# Patient Record
Sex: Male | Born: 1990
Health system: Southern US, Community
[De-identification: ages and names within clinical notes are randomized; demographics above are authoritative.]

## PROBLEM LIST (undated history)

## (undated) DIAGNOSIS — N200 Calculus of kidney: Secondary | ICD-10-CM

## (undated) DIAGNOSIS — J45909 Unspecified asthma, uncomplicated: Secondary | ICD-10-CM

## (undated) HISTORY — PX: HERNIA REPAIR: SHX51

---

## 2015-08-19 ENCOUNTER — Emergency Department (HOSPITAL_BASED_OUTPATIENT_CLINIC_OR_DEPARTMENT_OTHER): Payer: 59

## 2015-08-19 ENCOUNTER — Emergency Department (HOSPITAL_BASED_OUTPATIENT_CLINIC_OR_DEPARTMENT_OTHER)
Admission: EM | Admit: 2015-08-19 | Discharge: 2015-08-19 | Disposition: A | Discharge status: Home / Self Care | Payer: 59 | Attending: Emergency Medicine | Admitting: Emergency Medicine

## 2015-08-19 ENCOUNTER — Encounter (HOSPITAL_BASED_OUTPATIENT_CLINIC_OR_DEPARTMENT_OTHER): Payer: Self-pay | Admitting: *Deleted

## 2015-08-19 DIAGNOSIS — N23 Unspecified renal colic: Secondary | ICD-10-CM | POA: Diagnosis not present

## 2015-08-19 DIAGNOSIS — J45909 Unspecified asthma, uncomplicated: Secondary | ICD-10-CM | POA: Diagnosis not present

## 2015-08-19 DIAGNOSIS — R109 Unspecified abdominal pain: Secondary | ICD-10-CM | POA: Diagnosis present

## 2015-08-19 HISTORY — DX: Unspecified asthma, uncomplicated: J45.909

## 2015-08-19 HISTORY — DX: Calculus of kidney: N20.0

## 2015-08-19 LAB — URINALYSIS, ROUTINE W REFLEX MICROSCOPIC
Bilirubin Urine: NEGATIVE
GLUCOSE, UA: NEGATIVE mg/dL
KETONES UR: NEGATIVE mg/dL
Leukocytes, UA: NEGATIVE
Nitrite: NEGATIVE
PROTEIN: 30 mg/dL — AB
SPECIFIC GRAVITY, URINE: 1.023 (ref 1.005–1.030)
pH: 6 (ref 5.0–8.0)

## 2015-08-19 LAB — URINE MICROSCOPIC-ADD ON

## 2015-08-19 MED ORDER — ONDANSETRON HCL 4 MG/2ML IJ SOLN
4.0000 mg | Freq: Once | INTRAMUSCULAR | Status: AC
  Administered 2015-08-19: 4 mg via INTRAVENOUS
  Filled 2015-08-19: qty 2

## 2015-08-19 MED ORDER — KETOROLAC TROMETHAMINE 15 MG/ML IJ SOLN
15.0000 mg | Freq: Once | INTRAMUSCULAR | Status: AC
  Administered 2015-08-19: 15 mg via INTRAVENOUS
  Filled 2015-08-19: qty 1

## 2015-08-19 MED ORDER — SODIUM CHLORIDE 0.9 % IV SOLN
INTRAVENOUS | Status: DC
  Administered 2015-08-19: 1000 mL via INTRAVENOUS

## 2015-08-19 MED ORDER — HYDROMORPHONE HCL 1 MG/ML IJ SOLN: 1.0000 mg | Freq: Once | INTRAMUSCULAR | Status: DC | PRN

## 2015-08-19 NOTE — ED Notes (Addendum)
C/o rt flank pain onset this am,  w n/v x 1,  Pt states pain is not as bad now as at time of onset

## 2015-08-19 NOTE — ED Provider Notes (Signed)
CSN: [id number]     Arrival date & time 08/19/15  0422 History   First MD Initiated Contact with Patient 08/19/15 [phone]     Chief Complaint  Patient presents with  . Flank Pain     (Consider location/radiation/quality/duration/timing/severity/associated sxs/prior Treatment) HPI  This is a 25 year old male with a remote history of one kidney stone in the past. He is here with severe right flank pain that woke him up about an hour ago. He rated his pain as a 9 out of 10 initially and a 3 out of 10 now. He has had associated nausea with one episode of vomiting. The pain does not change with movement. His urine was grossly bloody.   Past Medical History  Diagnosis Date  . Kidney stones   . Asthma    No past surgical history on file. No family history on file. Social History  Substance Use Topics  . Smoking status: Never Smoker   . Smokeless tobacco: None  . Alcohol Use: Yes    Review of Systems  All other systems reviewed and are negative.   Allergies  Sulfa antibiotics  Home Medications   Prior to Admission medications   Not on File   BP 129/90 mmHg  Pulse 92  Temp(Src) 99 F (37.2 C) (Oral)  Resp 18  Ht 5\' 9"  (1.753 m)  Wt 320 lb (145.151 kg)  BMI 47.23 kg/m2  SpO2 99%   Physical Exam General: Well-developed, well-nourished male in no acute distress; appearance consistent with age of record HENT: normocephalic; atraumatic Eyes: pupils equal, round and reactive to light; extraocular muscles intact Neck: supple Heart: regular rate and rhythm Lungs: clear to auscultation bilaterally Abdomen: soft; nondistended; nontender; no masses or hepatosplenomegaly; bowel sounds present GU: No CVA tenderness Extremities: No deformity; full range of motion; pulses normal Neurologic: Awake, alert and oriented; motor function intact in all extremities and symmetric; no facial droop Skin: Warm and dry Psychiatric: Mildly anxious    ED Course  Procedures (including critical  care time)   MDM   Nursing notes and vitals signs, including pulse oximetry, reviewed.  Summary of this visit's results, reviewed by myself:  Labs:  Results for orders placed or performed during the hospital encounter of 08/19/15 (from the past 24 hour(s))  Urinalysis, Routine w reflex microscopic (not at [facility])     Status: Abnormal   Collection Time: 08/19/15  4:34 AM  Result Value Ref Range   Color, Urine RED (A) YELLOW   APPearance TURBID (A) CLEAR   Specific Gravity, Urine 1.023 1.005 - 1.030   pH 6.0 5.0 - 8.0   Glucose, UA NEGATIVE NEGATIVE mg/dL   Hgb urine dipstick LARGE (A) NEGATIVE   Bilirubin Urine NEGATIVE NEGATIVE   Ketones, ur NEGATIVE NEGATIVE mg/dL   Protein, ur 30 (A) NEGATIVE mg/dL   Nitrite NEGATIVE NEGATIVE   Leukocytes, UA NEGATIVE NEGATIVE  Urine microscopic-add on     Status: Abnormal   Collection Time: 08/19/15  4:34 AM  Result Value Ref Range   Squamous Epithelial / LPF 6-30 (A) NONE SEEN   WBC, UA 0-5 0 - 5 WBC/hpf   RBC / HPF TOO NUMEROUS TO COUNT 0 - 5 RBC/hpf   Bacteria, UA FEW (A) NONE SEEN    Imaging Studies: Ct Renal Stone Study  08/19/2015  CLINICAL DATA:  Initial evaluation for acute right flank pain. Hematuria. EXAM: CT ABDOMEN AND PELVIS WITHOUT CONTRAST TECHNIQUE: Multidetector CT imaging of the abdomen and pelvis was performed following the  standard protocol without IV contrast. COMPARISON:  None. FINDINGS: Visualized lung bases are clear. Limited noncontrast evaluation of the liver is unremarkable. Gallbladder demonstrates no acute abnormality. No biliary dilatation. Spleen, adrenal glands, and pancreas demonstrate a normal unenhanced appearance. Scattered nonobstructive calculi present within the left kidney, largest of which positioned in the lower pole and measures 4 mm. No radiopaque calculi seen along the course of the left renal collecting system. There is no left-sided hydronephrosis or hydroureter. On the right, scattered  nonobstructive calculi are present, largest of which measures 6 mm and is positioned in the lower pole. No radiopaque calculi seen along the course of the right renal collecting system. No right-sided hydronephrosis or hydroureter. Stomach within normal limits. No evidence for bowel obstruction. No abnormal wall thickening or inflammatory fat stranding seen about the bowels. Appendix is normal. Bladder decompressed without acute abnormality.  Prostate normal. No free air or fluid. Shotty subcentimeter periaortic lymph nodes present. No pathologically enlarged intra-abdominal or pelvic lymph nodes identified. No acute osseous abnormality. No worrisome lytic or blastic osseous lesions. IMPRESSION: 1. Bilateral nonobstructive nephrolithiasis as above. No CT evidence for obstructive uropathy. 2. No other acute intra-abdominal or pelvic process. Electronically Signed   By: Jeannine Boga M.D.   On: 08/19/2015 05:32   5:47 AM CT findings and history consistent with passed stone. Patient was advised of CT findings. He was advised that hematuria should resolve but if it persists we will refer to urology for further evaluation.     Shanon Rosser, MD 08/19/15 [phone]

## 2015-08-21 ENCOUNTER — Emergency Department (HOSPITAL_BASED_OUTPATIENT_CLINIC_OR_DEPARTMENT_OTHER)
Admission: EM | Admit: 2015-08-21 | Discharge: 2015-08-21 | Disposition: A | Discharge status: Home / Self Care | Payer: 59 | Attending: Emergency Medicine | Admitting: Emergency Medicine

## 2015-08-21 ENCOUNTER — Emergency Department (HOSPITAL_BASED_OUTPATIENT_CLINIC_OR_DEPARTMENT_OTHER): Payer: 59

## 2015-08-21 ENCOUNTER — Encounter (HOSPITAL_BASED_OUTPATIENT_CLINIC_OR_DEPARTMENT_OTHER): Payer: Self-pay | Admitting: Emergency Medicine

## 2015-08-21 DIAGNOSIS — N23 Unspecified renal colic: Secondary | ICD-10-CM | POA: Insufficient documentation

## 2015-08-21 DIAGNOSIS — J45909 Unspecified asthma, uncomplicated: Secondary | ICD-10-CM | POA: Diagnosis not present

## 2015-08-21 DIAGNOSIS — R109 Unspecified abdominal pain: Secondary | ICD-10-CM | POA: Diagnosis present

## 2015-08-21 LAB — BASIC METABOLIC PANEL
Anion gap: 6 (ref 5–15)
BUN: 15 mg/dL (ref 6–20)
CALCIUM: 9.1 mg/dL (ref 8.9–10.3)
CHLORIDE: 108 mmol/L (ref 101–111)
CO2: 26 mmol/L (ref 22–32)
CREATININE: 0.85 mg/dL (ref 0.61–1.24)
GFR calc Af Amer: >60 mL/min (ref >60)
GFR calc non Af Amer: >60 mL/min (ref >60)
GLUCOSE: 99 mg/dL (ref 65–99)
Potassium: 3.8 mmol/L (ref 3.5–5.1)
Sodium: 140 mmol/L (ref 135–145)

## 2015-08-21 LAB — URINE MICROSCOPIC-ADD ON

## 2015-08-21 LAB — CBC
HCT: 43.6 % (ref 39.0–52.0)
Hemoglobin: 14.5 g/dL (ref 13.0–17.0)
MCH: 28.1 pg (ref 26.0–34.0)
MCHC: 33.3 g/dL (ref 30.0–36.0)
MCV: 84.5 fL (ref 78.0–100.0)
Platelets: 267 10*3/uL (ref 150–400)
RBC: 5.16 MIL/uL (ref 4.22–5.81)
RDW: 13.1 % (ref 11.5–15.5)
WBC: 10.1 10*3/uL (ref 4.0–10.5)

## 2015-08-21 LAB — URINALYSIS, ROUTINE W REFLEX MICROSCOPIC
BILIRUBIN URINE: NEGATIVE
Glucose, UA: NEGATIVE mg/dL
KETONES UR: NEGATIVE mg/dL
Leukocytes, UA: NEGATIVE
NITRITE: NEGATIVE
PH: 8 (ref 5.0–8.0)
Protein, ur: NEGATIVE mg/dL
Specific Gravity, Urine: 1.02 (ref 1.005–1.030)

## 2015-08-21 MED ORDER — ONDANSETRON HCL 4 MG/2ML IJ SOLN
4.0000 mg | Freq: Once | INTRAMUSCULAR | Status: AC | PRN
  Administered 2015-08-21: 4 mg via INTRAVENOUS
  Filled 2015-08-21: qty 2

## 2015-08-21 MED ORDER — FENTANYL CITRATE (PF) 100 MCG/2ML IJ SOLN
50.0000 ug | INTRAMUSCULAR | Status: AC | PRN
  Administered 2015-08-21 (×2): 50 ug via INTRAVENOUS
  Filled 2015-08-21 (×2): qty 2

## 2015-08-21 MED ORDER — HYDROMORPHONE HCL 1 MG/ML IJ SOLN
0.5000 mg | Freq: Once | INTRAMUSCULAR | Status: AC
  Administered 2015-08-21: 0.5 mg via INTRAVENOUS
  Filled 2015-08-21: qty 1

## 2015-08-21 MED ORDER — KETOROLAC TROMETHAMINE 30 MG/ML IJ SOLN
30.0000 mg | Freq: Once | INTRAMUSCULAR | Status: AC
  Administered 2015-08-21: 30 mg via INTRAVENOUS
  Filled 2015-08-21: qty 1

## 2015-08-21 MED ORDER — NAPROXEN 375 MG PO TABS: 375.0000 mg | ORAL_TABLET | Freq: Two times a day (BID) | ORAL | Status: AC

## 2015-08-21 MED ORDER — HYDROCODONE-ACETAMINOPHEN 5-325 MG PO TABS: 1.0000 | ORAL_TABLET | ORAL | Status: AC | PRN

## 2015-08-21 NOTE — ED Notes (Signed)
MD at bedside. 

## 2015-08-21 NOTE — ED Notes (Signed)
Sudden onset of R flank pain 45 min ago. Dx with multiple kidney stones 2 days ago. Pt states he has passed some. Pt reports N/V

## 2015-08-21 NOTE — Discharge Instructions (Signed)
Kidney Stones °Kidney stones (urolithiasis) are deposits that form inside your kidneys. The intense pain is caused by the stone moving through the urinary tract. When the stone moves, the ureter goes into spasm around the stone. The stone is usually passed in the urine.  °CAUSES  °· A disorder that makes certain neck glands produce too much parathyroid hormone (primary hyperparathyroidism). °· A buildup of uric acid crystals, similar to gout in your joints. °· Narrowing (stricture) of the ureter. °· A kidney obstruction present at birth (congenital obstruction). °· Previous surgery on the kidney or ureters. °· Numerous kidney infections. °SYMPTOMS  °· Feeling sick to your stomach (nauseous). °· Throwing up (vomiting). °· Blood in the urine (hematuria). °· Pain that usually spreads (radiates) to the groin. °· Frequency or urgency of urination. °DIAGNOSIS  °· Taking a history and physical exam. °· Blood or urine tests. °· CT scan. °· Occasionally, an examination of the inside of the urinary bladder (cystoscopy) is performed. °TREATMENT  °· Observation. °· Increasing your fluid intake. °· Extracorporeal shock wave lithotripsy--This is a noninvasive procedure that uses shock waves to break up kidney stones. °· Surgery may be needed if you have severe pain or persistent obstruction. There are various surgical procedures. Most of the procedures are performed with the use of small instruments. Only small incisions are needed to accommodate these instruments, so recovery time is minimized. °The size, location, and chemical composition are all important variables that will determine the proper choice of action for you. Talk to your health care provider to better understand your situation so that you will minimize the risk of injury to yourself and your kidney.  °HOME CARE INSTRUCTIONS  °· Drink enough water and fluids to keep your urine clear or pale yellow. This will help you to pass the stone or stone fragments. °· Strain  all urine through the provided strainer. Keep all particulate matter and stones for your health care provider to see. The stone causing the pain may be as small as a grain of salt. It is very important to use the strainer each and every time you pass your urine. The collection of your stone will allow your health care provider to analyze it and verify that a stone has actually passed. The stone analysis will often identify what you can do to reduce the incidence of recurrences. °· Only take over-the-counter or prescription medicines for pain, discomfort, or fever as directed by your health care provider. °· Keep all follow-up visits as told by your health care provider. This is important. °· Get follow-up X-rays if required. The absence of pain does not always mean that the stone has passed. It may have only stopped moving. If the urine remains completely obstructed, it can cause loss of kidney function or even complete destruction of the kidney. It is your responsibility to make sure X-rays and follow-ups are completed. Ultrasounds of the kidney can show blockages and the status of the kidney. Ultrasounds are not associated with any radiation and can be performed easily in a matter of minutes. °· Make changes to your daily diet as told by your health care provider. You may be told to: °¨ Limit the amount of salt that you eat. °¨ Eat 5 or more servings of fruits and vegetables each day. °¨ Limit the amount of meat, poultry, fish, and eggs that you eat. °· Collect a 24-hour urine sample as told by your health care provider. You may need to collect another urine sample every 6-12   months. °SEEK MEDICAL CARE IF: °· You experience pain that is progressive and unresponsive to any pain medicine you have been prescribed. °SEEK IMMEDIATE MEDICAL CARE IF:  °· Pain cannot be controlled with the prescribed medicine. °· You have a fever or shaking chills. °· The severity or intensity of pain increases over 18 hours and is not  relieved by pain medicine. °· You develop a new onset of abdominal pain. °· You feel faint or pass out. °· You are unable to urinate. °  °This information is not intended to replace advice given to you by your health care provider. Make sure you discuss any questions you have with your health care provider. °  °Document Released: 02/20/2005 Document Revised: 11/11/2014 Document Reviewed: 07/24/2012 °Elsevier Interactive Patient Education ©2016 [employer] ° °

## 2015-08-21 NOTE — ED Provider Notes (Signed)
CSN: [id number]     Arrival date & time 08/21/15  [id number] History   First MD Initiated Contact with Patient 08/21/15 0754     Chief Complaint  Patient presents with  . Flank Pain     (Consider location/radiation/quality/duration/timing/severity/associated sxs/prior Treatment) HPI Comments: Patient presents with right flank pain. He has a history of a prior kidney stone several years ago. He was seen here 2 days ago with right flank pain. He had gross hematuria at that time. He had a CT scan which did not show any evidence of a passing stone although there were stones in both kidneys. There was no hydronephrosis.  He states that today the pain completely went away. He woke up this morning about 7:00 with sudden onset of pain again in his right flank. It now feels lower and radiates to his right groin. He's had associated nausea and vomiting. No fevers. He states initially he has hematuria went away but now it seems to be back again. His pain is progressively worsened since it started.  Patient is a 25 y.o. male presenting with flank pain.  Flank Pain Associated symptoms include abdominal pain. Pertinent negatives include no chest pain, no headaches and no shortness of breath.    Past Medical History  Diagnosis Date  . Kidney stones   . Asthma    History reviewed. No pertinent past surgical history. No family history on file. Social History  Substance Use Topics  . Smoking status: Never Smoker   . Smokeless tobacco: None  . Alcohol Use: Yes    Review of Systems  Constitutional: Negative for fever, chills, diaphoresis and fatigue.  HENT: Negative for congestion, rhinorrhea and sneezing.   Eyes: Negative.   Respiratory: Negative for cough, chest tightness and shortness of breath.   Cardiovascular: Negative for chest pain and leg swelling.  Gastrointestinal: Positive for nausea, vomiting and abdominal pain. Negative for diarrhea and blood in stool.  Genitourinary: Positive for flank  pain. Negative for frequency, hematuria and difficulty urinating.  Musculoskeletal: Negative for back pain and arthralgias.  Skin: Negative for rash.  Neurological: Negative for dizziness, speech difficulty, weakness, numbness and headaches.      Allergies  Sulfa antibiotics  Home Medications   Prior to Admission medications   Medication Sig Start Date End Date Taking? Authorizing Provider  HYDROcodone-acetaminophen (NORCO/VICODIN) 5-325 MG tablet Take 1-2 tablets by mouth every 4 (four) hours as needed. 08/21/15   Malvin Johns, MD  naproxen (NAPROSYN) 375 MG tablet Take 1 tablet (375 mg total) by mouth 2 (two) times daily. 08/21/15   Malvin Johns, MD   BP 123/82 mmHg  Pulse 75  Temp(Src) 98.6 F (37 C) (Oral)  Resp 17  Ht 5\' 10"  (1.778 m)  Wt 320 lb (145.151 kg)  BMI 45.92 kg/m2  SpO2 100% Physical Exam  Constitutional: He is oriented to person, place, and time. He appears well-developed and well-nourished. He appears distressed (Appears uncomfortable).  HENT:  Head: Normocephalic and atraumatic.  Eyes: Pupils are equal, round, and reactive to light.  Neck: Normal range of motion. Neck supple.  Cardiovascular: Normal rate, regular rhythm and normal heart sounds.   Pulmonary/Chest: Effort normal and breath sounds normal. No respiratory distress. He has no wheezes. He has no rales. He exhibits no tenderness.  Abdominal: Soft. Bowel sounds are normal. There is tenderness (Moderate tenderness to the right lower abdomen and right inguinal area. No palpable hernia.). There is no rebound and no guarding.  Genitourinary:  No palpable tenderness  to his testicles  Musculoskeletal: Normal range of motion. He exhibits no edema.  Lymphadenopathy:    He has no cervical adenopathy.  Neurological: He is alert and oriented to person, place, and time.  Skin: Skin is warm and dry. No rash noted.  Psychiatric: He has a normal mood and affect.    ED Course  Procedures (including critical  care time) Labs Review Labs Reviewed  URINALYSIS, ROUTINE W REFLEX MICROSCOPIC (NOT AT [facility]) - Abnormal; Notable for the following:    APPearance CLOUDY (*)    Hgb urine dipstick MODERATE (*)    All other components within normal limits  URINE MICROSCOPIC-ADD ON - Abnormal; Notable for the following:    Squamous Epithelial / LPF 0-5 (*)    Bacteria, UA MANY (*)    Casts GRANULAR CAST (*)    All other components within normal limits  CBC  BASIC METABOLIC PANEL    Imaging Review Dg Abd 1 View  08/21/2015  CLINICAL DATA:  Patient with right flank pain. Recent passage of a right renal stone. EXAM: ABDOMEN - 1 VIEW COMPARISON:  CT abdomen pelvis 08/19/2015 FINDINGS: Lung bases are clear. Gas is demonstrated within nondilated loops of large and small bowel in a nonobstructed pattern. Unremarkable osseous skeleton. Known bilateral renal stones are not well demonstrated on current evaluation. IMPRESSION: Nonobstructed bowel gas pattern. Known bilateral renal stones are not well demonstrated on current evaluation. Electronically Signed   By: Lovey Newcomer M.D.   On: 08/21/2015 09:27   I have personally reviewed and evaluated these images and lab results as part of my medical decision-making.   EKG Interpretation None      MDM   Final diagnoses:  Renal colic    Patient presents with right flank pain consistent with his prior renal colic. He had a recent CT scan 2 days ago which showed stones in the kidneys but no ureteral stones. He does have ongoing hematuria. There is no evidence of infection. He has no discrete testicular pain that would be concerning for torsion. He was discharged home in good condition. His pain was controlled in the ED with pain medications. He was encouraged to have outpatient follow-up with urology within the next week. He was advised to return here if he has any worsening pain vomiting fevers or difficulty urinating. He was given prescriptions for Naprosyn and Vicodin  for pain control.    Malvin Johns, MD 08/21/15 [phone]

## 2015-08-21 NOTE — ED Notes (Signed)
Patient transported to X-ray 

## 2016-04-07 DIAGNOSIS — K648 Other hemorrhoids: Secondary | ICD-10-CM | POA: Diagnosis not present

## 2016-08-03 DIAGNOSIS — M76891 Other specified enthesopathies of right lower limb, excluding foot: Secondary | ICD-10-CM | POA: Diagnosis not present

## 2017-03-17 DIAGNOSIS — Z0000 Encounter for general adult medical examination without abnormal findings: Secondary | ICD-10-CM | POA: Diagnosis not present

## 2017-05-22 IMAGING — CT CT RENAL STONE PROTOCOL
2 of 4 series · 17 of 46 positions shown, 19 images · non-contrast
Comparison: None.

CLINICAL DATA: Initial evaluation for acute right flank pain.
Hematuria.

EXAM:
CT ABDOMEN AND PELVIS WITHOUT CONTRAST
TECHNIQUE: Multidetector CT imaging of the abdomen and pelvis was performed
following the standard protocol without IV contrast.

[Series 2: axial st · axial · 0.98mm/px · z∈[-571,-76]mm · 14 of 109 slices shown, 16 images]
[im 5/109  soft-tissue]
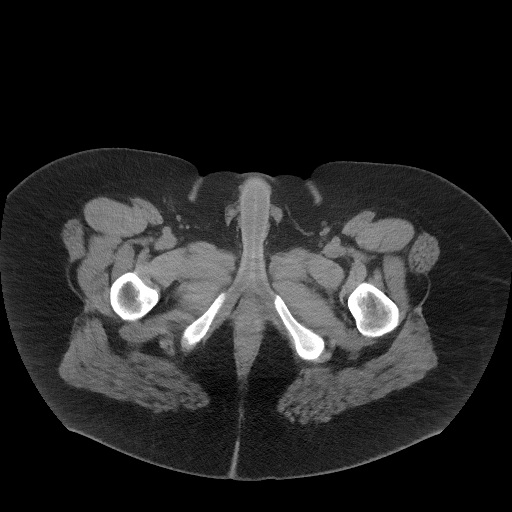
[im 5/109  bone]
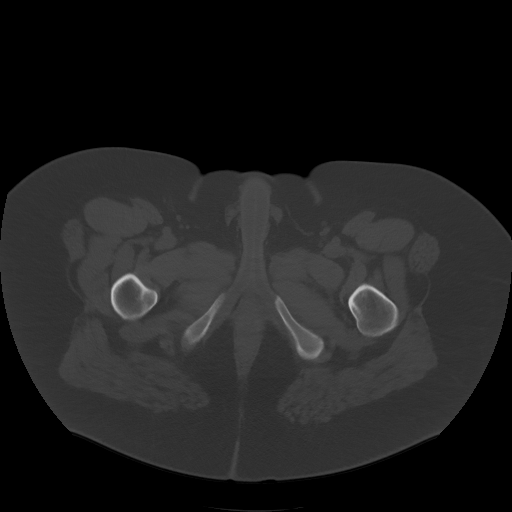
[im 14/109  soft-tissue]
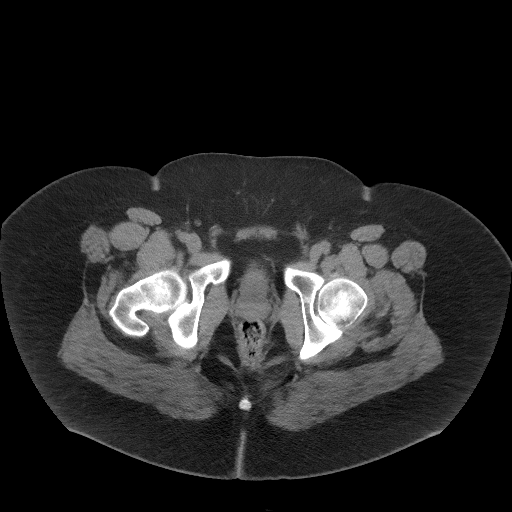
[im 23/109  soft-tissue]
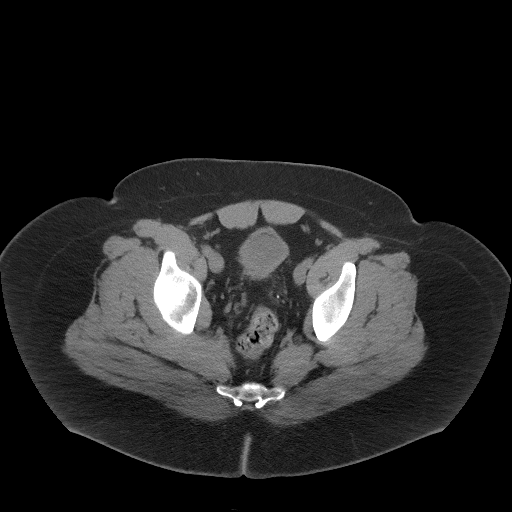
[im 28/109  soft-tissue]
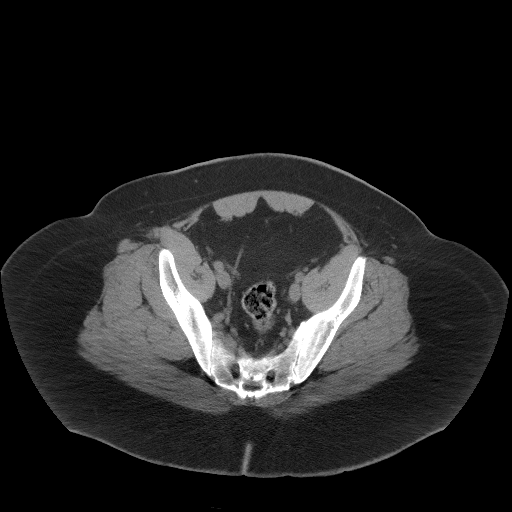
[im 37/109  soft-tissue]
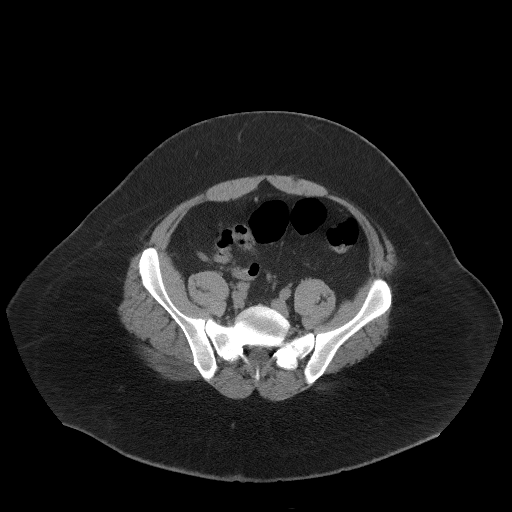
[im 46/109  soft-tissue]
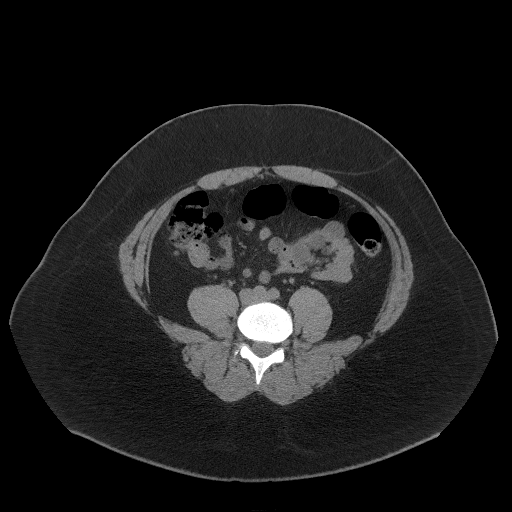
[im 50/109  soft-tissue]
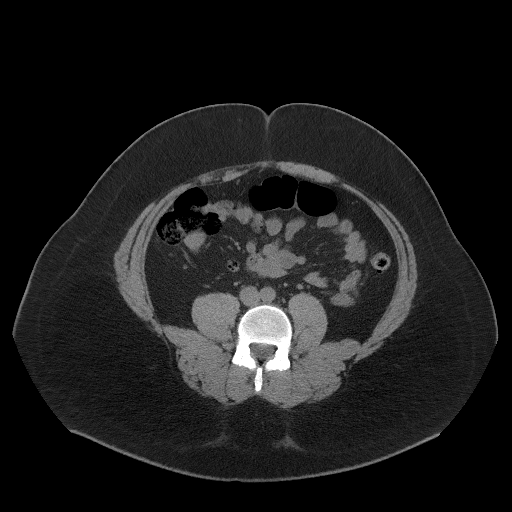
[im 59/109  soft-tissue]
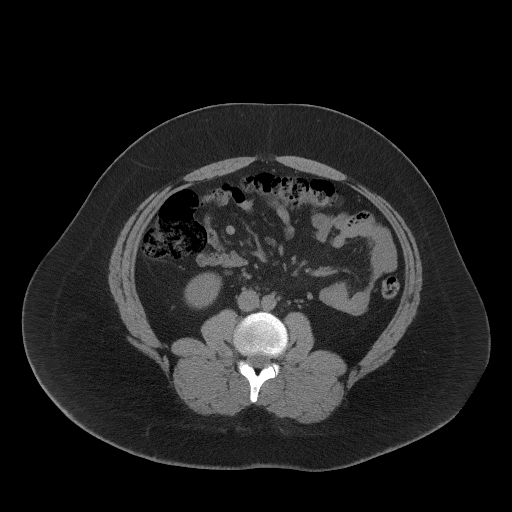
[im 64/109  soft-tissue]
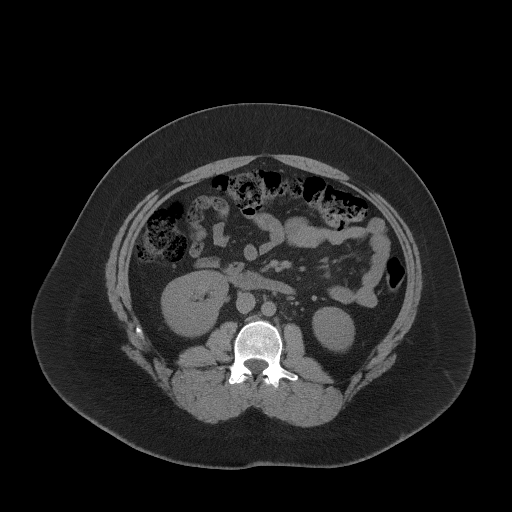
[im 64/109  bone]
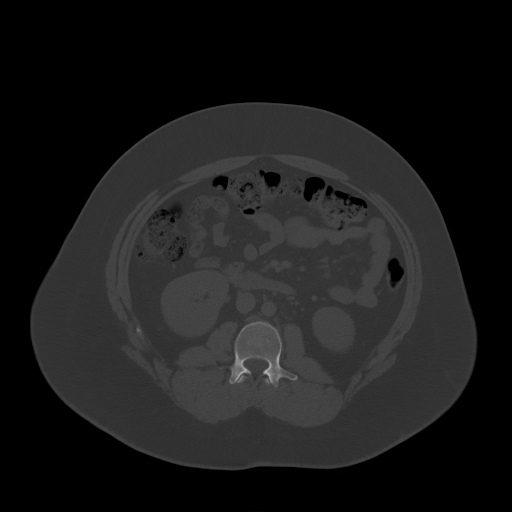
[im 73/109  soft-tissue]
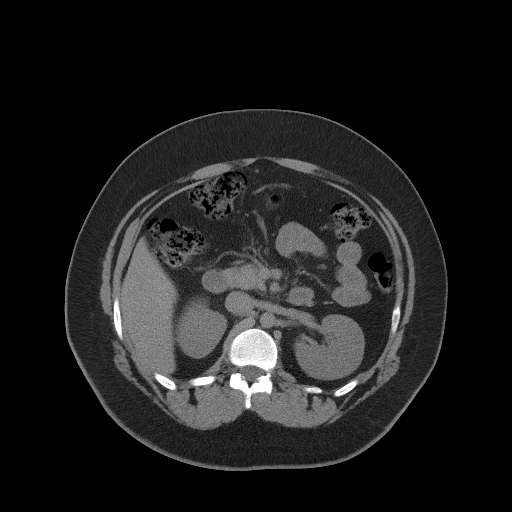
[im 82/109  soft-tissue]
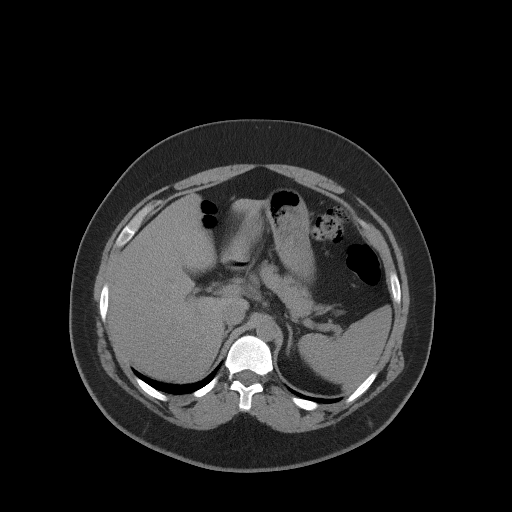
[im 86/109  soft-tissue]
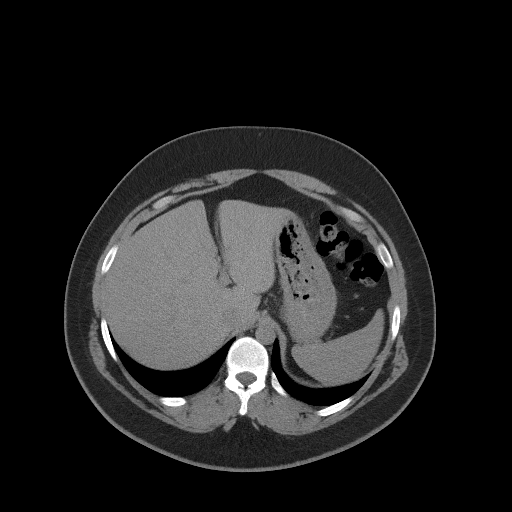
[im 95/109  soft-tissue]
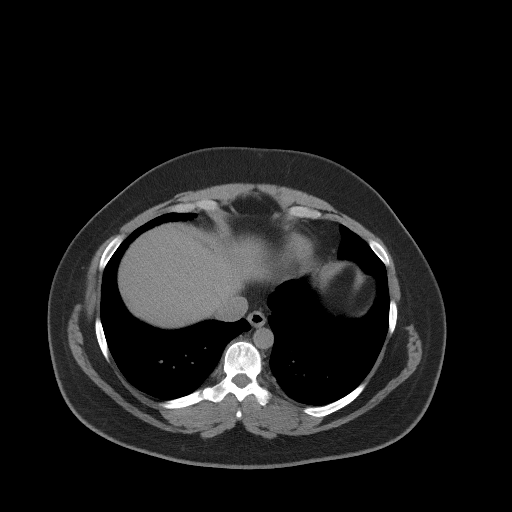
[im 104/109  soft-tissue]
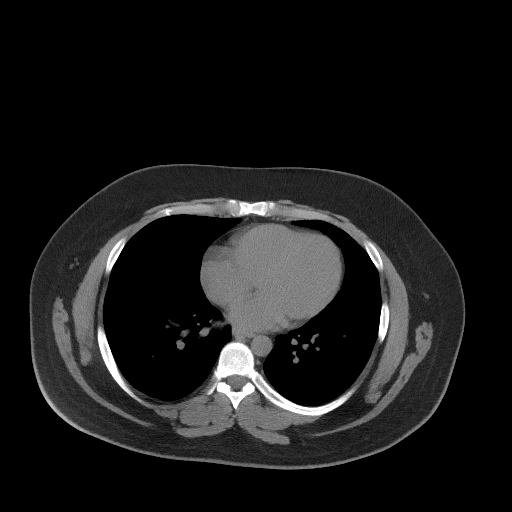

[Series 5: coronal st · coronal · 1.05mm/px · 3 of 100 slices shown]
[im 34/100  soft-tissue]
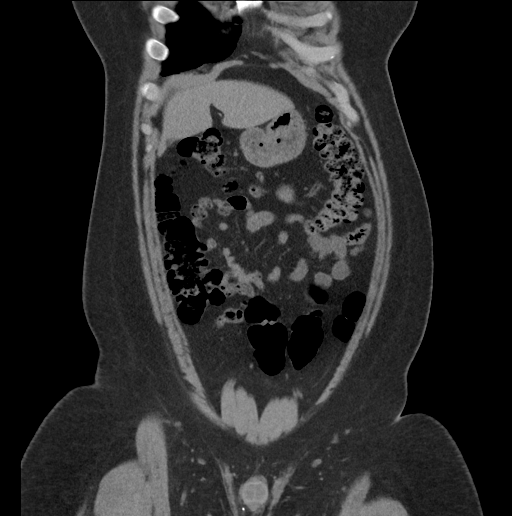
[im 45/100  soft-tissue]
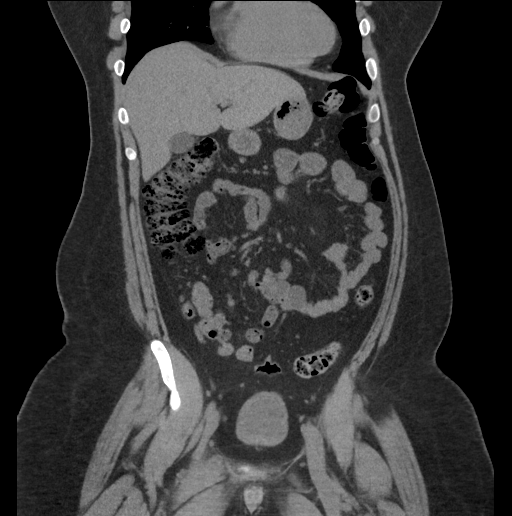
[im 56/100  soft-tissue]
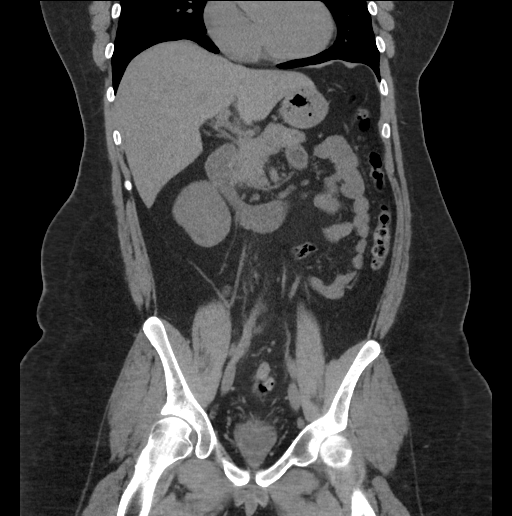

[17 of 46 positions shown; findings below may reference images not displayed]

FINDINGS: Visualized lung bases are clear.

Limited noncontrast evaluation of the liver is unremarkable.
Gallbladder demonstrates no acute abnormality. No biliary
dilatation. Spleen, adrenal glands, and pancreas demonstrate a
normal unenhanced appearance.

Scattered nonobstructive calculi present within the left kidney,
largest of which positioned in the lower pole and measures 4 mm. No
radiopaque calculi seen along the course of the left renal
collecting system. There is no left-sided hydronephrosis or
hydroureter.

On the right, scattered nonobstructive calculi are present, largest
of which measures 6 mm and is positioned in the lower pole. No
radiopaque calculi seen along the course of the right renal
collecting system. No right-sided hydronephrosis or hydroureter.

Stomach within normal limits. No evidence for bowel obstruction. No
abnormal wall thickening or inflammatory fat stranding seen about
the bowels. Appendix is normal.

Bladder decompressed without acute abnormality.  Prostate normal.

No free air or fluid. Shotty subcentimeter periaortic lymph nodes
present. No pathologically enlarged intra-abdominal or pelvic lymph
nodes identified.

No acute osseous abnormality. No worrisome lytic or blastic osseous
lesions.
IMPRESSION: 1. Bilateral nonobstructive nephrolithiasis as above. No CT evidence
for obstructive uropathy.
2. No other acute intra-abdominal or pelvic process.

## 2017-05-24 IMAGING — CR DG ABDOMEN 1V
2 series · 2 of 2 positions shown · non-contrast
Comparison: CT abdomen pelvis 08/19/2015

CLINICAL DATA: Patient with right flank pain. Recent passage of a
right renal stone.

EXAM:
ABDOMEN - 1 VIEW

[t abdomen supine * (1 of 2)]
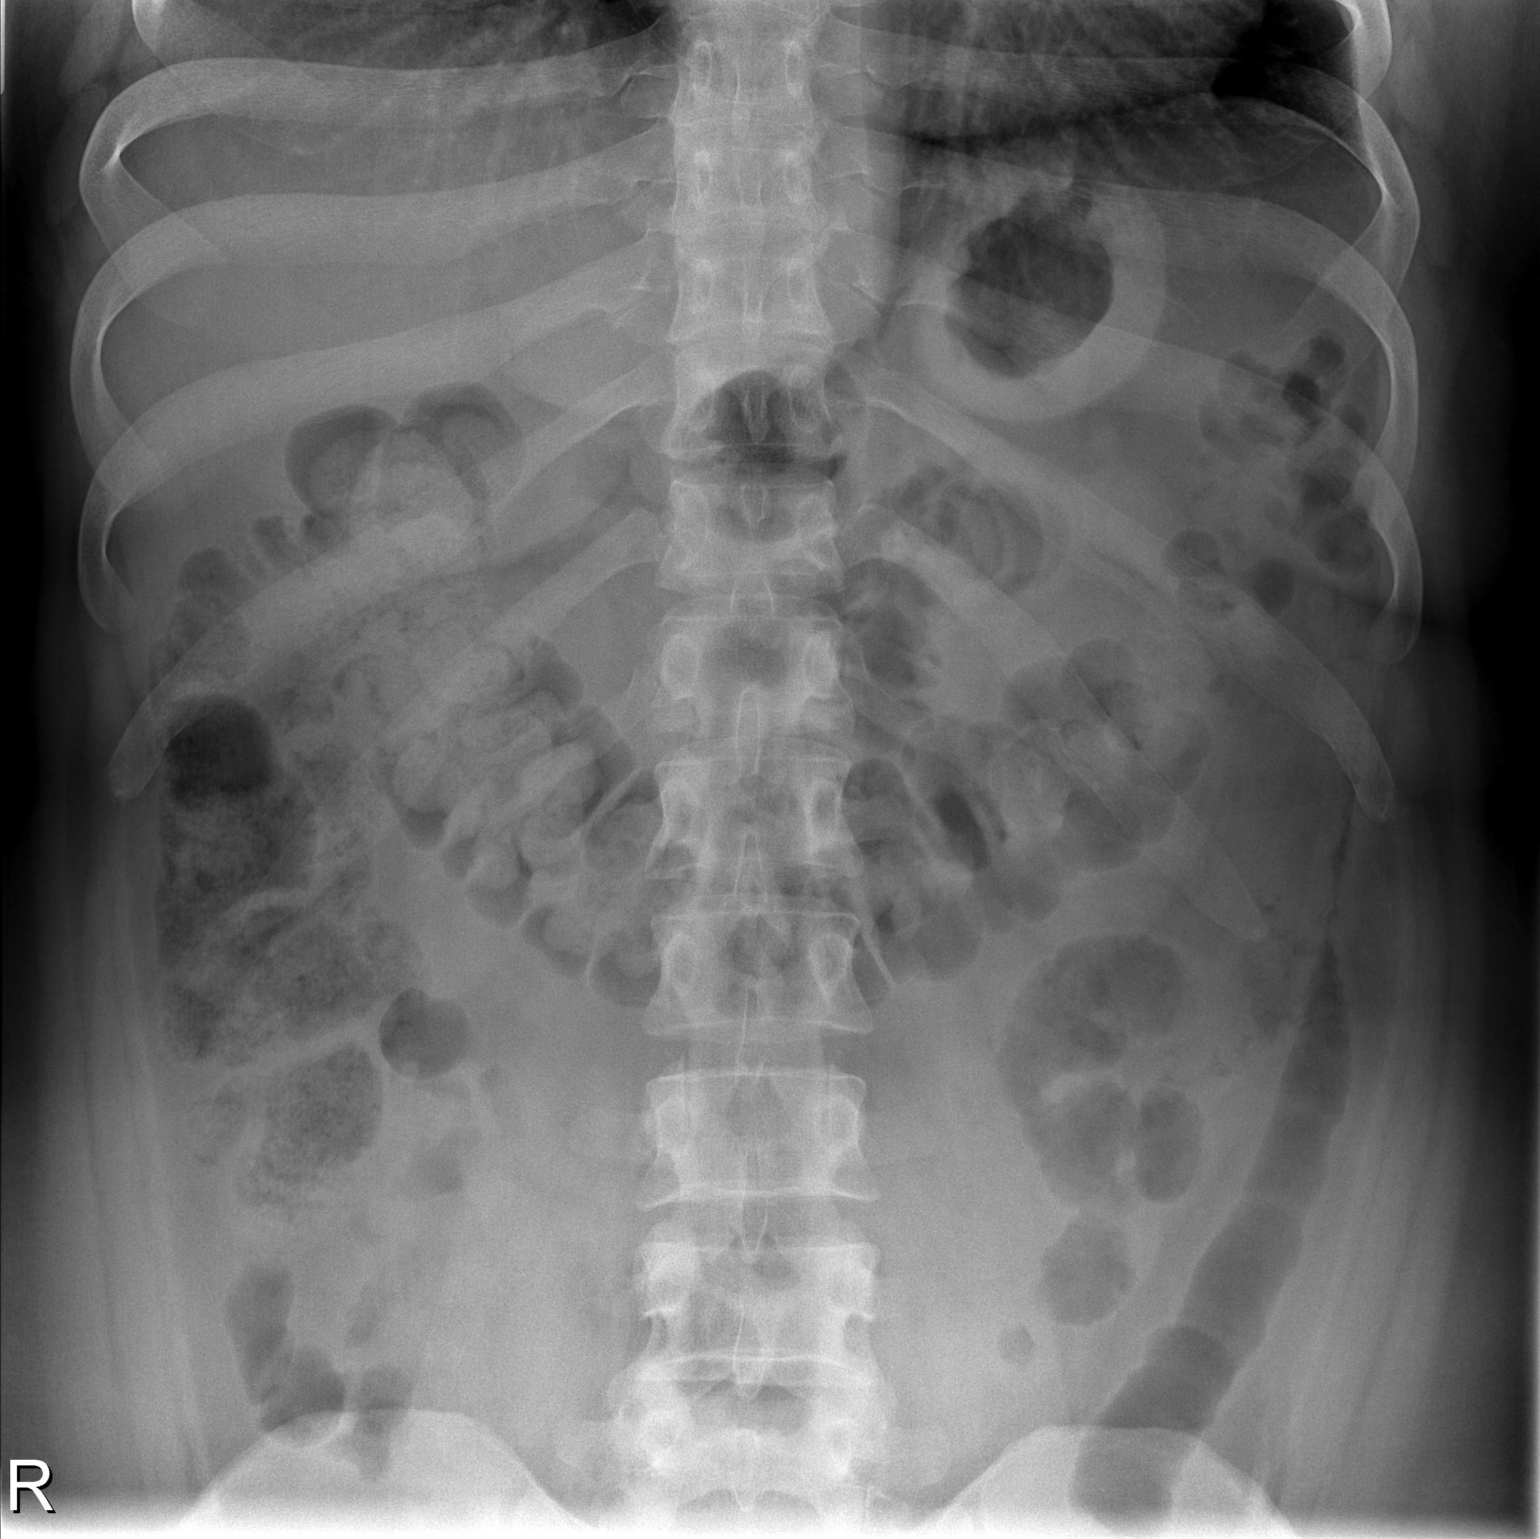

[t abdomen supine * (2 of 2)]
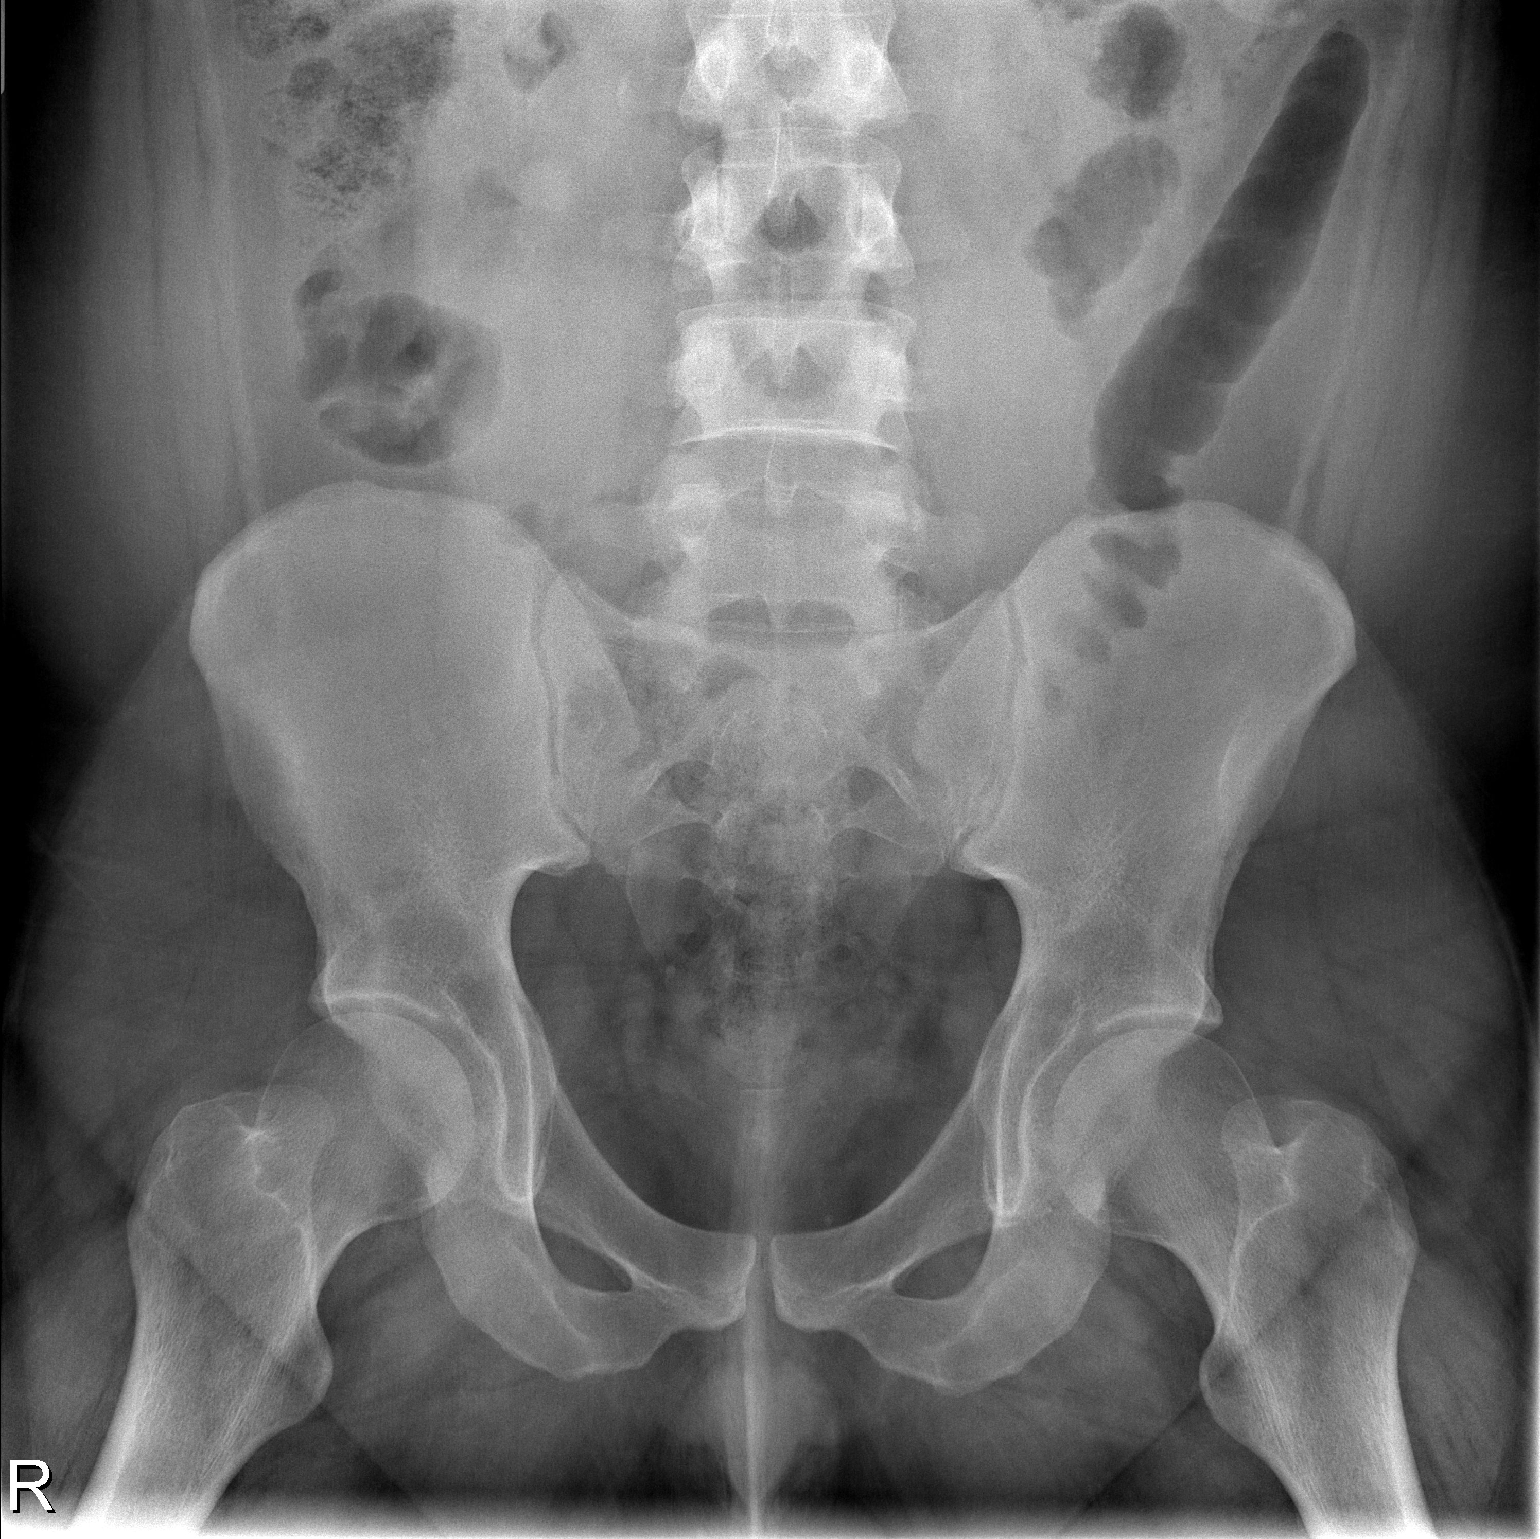

[2 of 2 positions shown; findings below may reference images not displayed]

FINDINGS: Lung bases are clear. Gas is demonstrated within nondilated loops of
large and small bowel in a nonobstructed pattern. Unremarkable
osseous skeleton. Known bilateral renal stones are not well
demonstrated on current evaluation.
IMPRESSION: Nonobstructed bowel gas pattern.

Known bilateral renal stones are not well demonstrated on current
evaluation.

## 2017-05-25 DIAGNOSIS — H16143 Punctate keratitis, bilateral: Secondary | ICD-10-CM | POA: Diagnosis not present

## 2017-05-26 ENCOUNTER — Emergency Department (HOSPITAL_BASED_OUTPATIENT_CLINIC_OR_DEPARTMENT_OTHER)
Admission: EM | Admit: 2017-05-26 | Discharge: 2017-05-26 | Disposition: A | Discharge status: Home / Self Care | Payer: 59 | Attending: Emergency Medicine | Admitting: Emergency Medicine

## 2017-05-26 ENCOUNTER — Other Ambulatory Visit: Payer: Self-pay

## 2017-05-26 ENCOUNTER — Encounter (HOSPITAL_BASED_OUTPATIENT_CLINIC_OR_DEPARTMENT_OTHER): Payer: Self-pay | Admitting: *Deleted

## 2017-05-26 DIAGNOSIS — H109 Unspecified conjunctivitis: Secondary | ICD-10-CM

## 2017-05-26 DIAGNOSIS — J45909 Unspecified asthma, uncomplicated: Secondary | ICD-10-CM | POA: Diagnosis not present

## 2017-05-26 DIAGNOSIS — H53149 Visual discomfort, unspecified: Secondary | ICD-10-CM | POA: Insufficient documentation

## 2017-05-26 DIAGNOSIS — H5789 Other specified disorders of eye and adnexa: Secondary | ICD-10-CM | POA: Diagnosis present

## 2017-05-26 MED ORDER — FLUORESCEIN SODIUM 1 MG OP STRP
1.0000 | ORAL_STRIP | Freq: Once | OPHTHALMIC | Status: AC
  Administered 2017-05-26: 1 via OPHTHALMIC
  Filled 2017-05-26: qty 1

## 2017-05-26 MED ORDER — IBUPROFEN 400 MG PO TABS
600.0000 mg | ORAL_TABLET | Freq: Once | ORAL | Status: AC
  Administered 2017-05-26: 600 mg via ORAL
  Filled 2017-05-26: qty 1

## 2017-05-26 MED ORDER — TETRACAINE HCL 0.5 % OP SOLN
1.0000 [drp] | Freq: Once | OPHTHALMIC | Status: AC
  Administered 2017-05-26: 1 [drp] via OPHTHALMIC

## 2017-05-26 MED ORDER — ERYTHROMYCIN 5 MG/GM OP OINT: TOPICAL_OINTMENT | OPHTHALMIC | 0 refills | Status: AC

## 2017-05-26 MED ORDER — TETRACAINE HCL 0.5 % OP SOLN
1.0000 [drp] | Freq: Once | OPHTHALMIC | Status: AC
  Administered 2017-05-26: 1 [drp] via OPHTHALMIC
  Filled 2017-05-26: qty 4

## 2017-05-26 NOTE — Discharge Instructions (Signed)
You have an eye infection called conjunctivitis.  This can be caused by a viral or bacterial infection, but we treat with antibiotics either way to be safe.  Please use the provided antibiotics or fill the provided prescription and use as directed.  Follow up as indicated on your instructions.  Return to the Emergency Department if your symptoms worsen in spite of treatment or if you develop new symptoms that concern you. ° °

## 2017-05-26 NOTE — ED Notes (Addendum)
Alert, NAD, calm, interactive, resps e/u, speaking in clear complete sentences, no dyspnea noted, skin W&D, VSS, c/o R eye irritation, redness, pain and tearing, (denies: HA, fever, drainage, visual changes, sob, nausea, or dizziness), "have just been using lubricating drops",  EDP into room. Family at [facility].

## 2017-05-26 NOTE — ED Provider Notes (Signed)
Emergency Department Provider Note   I have reviewed the triage vital signs and the nursing notes.   HISTORY  Chief Complaint Eye Problem   HPI Gene Adams is a 27 y.o. male presents to the ED with right eye redness, pain, and light sensitivity over the last 4 days. No injury to the eye. No significant discharge. No fever or chills. No pain with EOM. Patient does not wear contact lenses. He went to an optometrist who advised lubricating eye drops which he has used to no effect. No radiation of symptoms.    Past Medical History:  Diagnosis Date  . Asthma   . Kidney stones     There are no active problems to display for this patient.   Past Surgical History:  Procedure Laterality Date  . HERNIA REPAIR      Current Outpatient Rx  . Order #: [id number] Class: Print  . Order #: [id number] Class: Print  . Order #: [id number] Class: Print    Allergies Sulfa antibiotics  No family history on file.  Social History Social History   Tobacco Use  . Smoking status: Never Smoker  Substance Use Topics  . Alcohol use: Yes  . Drug use: No    Review of Systems  Constitutional: No fever/chills Eyes: Positive right eye pain and blurry vision.  ENT: No sore throat. Cardiovascular: Denies chest pain. Respiratory: Denies shortness of breath. Gastrointestinal: No abdominal pain.  No nausea, no vomiting.  No diarrhea.  No constipation. Genitourinary: Negative for dysuria. Musculoskeletal: Negative for back pain. Skin: Negative for rash. Neurological: Negative for headaches, focal weakness or numbness.  10-point ROS otherwise negative.  ____________________________________________   PHYSICAL EXAM:  VITAL SIGNS: ED Triage Vitals  Enc Vitals Group     BP 05/26/17 2039 (!) 141/89     Pulse Rate 05/26/17 2039 94     Resp 05/26/17 2039 20     Temp 05/26/17 2039 98.1 F (36.7 C)     Temp Source 05/26/17 2039 Oral     SpO2 05/26/17 2039 96 %     Weight 05/26/17 2040  (!) 320 lb (145.2 kg)     Height 05/26/17 2040 5\' 9"  (1.753 m)     Pain Score 05/26/17 2039 3   Constitutional: Alert and oriented. Well appearing and in no acute distress. Eyes: Conjunctivae are mildly erythematous on the right. Normal EOM. Fluorescein exam with no obvious corneal abrasion. Pain relieved with tetracaine. No periorbital edema/redness.  Head: Atraumatic. Nose: No congestion/rhinnorhea. Mouth/Throat: Mucous membranes are moist.   Neck: No stridor.   Cardiovascular: Good peripheral circulation. Respiratory: Normal respiratory effort.   Gastrointestinal: No distention.  Musculoskeletal: No gross deformities of extremities. Neurologic:  Normal speech and language.  Skin:  Skin is warm, dry and intact. No rash noted.  ____________________________________________  RADIOLOGY  None ____________________________________________   PROCEDURES  Procedure(s) performed:   Procedures  None ____________________________________________   INITIAL IMPRESSION / ASSESSMENT AND PLAN / ED COURSE  Pertinent labs & imaging results that were available during my care of the patient were reviewed by me and considered in my medical decision making (see chart for details).  Patient presents to the ED with right eye pain. No clear corneal abrasion noted. No concern clinically or orbital cellulitis. Possible conjunctivitis vs small abrasion. Will treat with erythromycin ointment and ophthalmology follow up.   At this time, I do not feel there is any life-threatening condition present. I have reviewed and discussed all results (EKG, imaging, lab, urine as appropriate),  exam findings with patient. I have reviewed nursing notes and appropriate previous records.  I feel the patient is safe to be discharged home without further emergent workup. Discussed usual and customary return precautions. Patient and family (if present) verbalize understanding and are comfortable with this plan.  Patient  will follow-up with their primary care provider. If they do not have a primary care provider, information for follow-up has been provided to them. All questions have been answered.  ____________________________________________  FINAL CLINICAL IMPRESSION(S) / ED DIAGNOSES  Final diagnoses:  Conjunctivitis of right eye, unspecified conjunctivitis type     MEDICATIONS GIVEN DURING THIS VISIT:  Medications  tetracaine (PONTOCAINE) 0.5 % ophthalmic solution 1 drop (1 drop Right Eye Given 05/26/17 2234)  ibuprofen (ADVIL,MOTRIN) tablet 600 mg (600 mg Oral Given 05/26/17 2234)  fluorescein ophthalmic strip 1 strip (1 strip Right Eye Given 05/26/17 2234)  tetracaine (PONTOCAINE) 0.5 % ophthalmic solution 1 drop (1 drop Right Eye Given 05/26/17 2300)     NEW OUTPATIENT MEDICATIONS STARTED DURING THIS VISIT:  Discharge Medication List as of 05/26/2017 10:54 PM    START taking these medications   Details  erythromycin ophthalmic ointment Place a 1/2 inch ribbon of ointment into the lower eyelid 4 times per day for the next week., Print        Note:  This document was prepared using Dragon voice recognition software and may include unintentional dictation errors.  Nanda Quinton, MD Emergency Medicine    Long, [facility], MD 05/27/17 [id number]

## 2017-05-26 NOTE — ED Notes (Signed)
Pt given d/c instructions as per chart. Rx x 1. Verbalizes understanding. No questions. 

## 2017-05-26 NOTE — ED Triage Notes (Signed)
Pt reports since Thursday he has had pain, light sensitivy and redness in the right eye. Pt was seen at eye doctor, given eye drops for the same. Denies drainage or injury

## 2017-06-01 DIAGNOSIS — H11433 Conjunctival hyperemia, bilateral: Secondary | ICD-10-CM | POA: Diagnosis not present

## 2017-06-01 DIAGNOSIS — H47333 Pseudopapilledema of optic disc, bilateral: Secondary | ICD-10-CM | POA: Diagnosis not present

## 2017-06-01 DIAGNOSIS — B308 Other viral conjunctivitis: Secondary | ICD-10-CM | POA: Diagnosis not present

## 2017-07-03 DIAGNOSIS — H47322 Drusen of optic disc, left eye: Secondary | ICD-10-CM | POA: Diagnosis not present

## 2017-07-03 DIAGNOSIS — H47321 Drusen of optic disc, right eye: Secondary | ICD-10-CM | POA: Diagnosis not present

## 2017-07-03 DIAGNOSIS — B308 Other viral conjunctivitis: Secondary | ICD-10-CM | POA: Diagnosis not present

## 2017-07-05 DIAGNOSIS — L820 Inflamed seborrheic keratosis: Secondary | ICD-10-CM | POA: Diagnosis not present

## 2017-07-05 DIAGNOSIS — D485 Neoplasm of uncertain behavior of skin: Secondary | ICD-10-CM | POA: Diagnosis not present

## 2017-07-05 DIAGNOSIS — D1801 Hemangioma of skin and subcutaneous tissue: Secondary | ICD-10-CM | POA: Diagnosis not present

## 2018-01-16 DIAGNOSIS — J029 Acute pharyngitis, unspecified: Secondary | ICD-10-CM | POA: Diagnosis not present

## 2018-02-25 DIAGNOSIS — Z23 Encounter for immunization: Secondary | ICD-10-CM | POA: Diagnosis not present

## 2019-01-16 ENCOUNTER — Other Ambulatory Visit: Payer: Self-pay

## 2019-01-16 ENCOUNTER — Encounter: Payer: Self-pay | Admitting: Registered"

## 2019-01-16 ENCOUNTER — Encounter: Payer: 59 | Attending: Family Medicine | Admitting: Registered"

## 2019-01-16 DIAGNOSIS — E669 Obesity, unspecified: Secondary | ICD-10-CM | POA: Insufficient documentation

## 2019-01-16 NOTE — Patient Instructions (Addendum)
-   Aim to increase physical activity to at least 30-45 min, 2 days/week.   - Check out [employer] app as option for at-home workouts.   - Aim to have breakfast daily containing at least carbohydrates + protein.   - Aim to have protein, carbohydrates, and non-starchy vegetables with lunch and dinner. Use meal ideas sheet as guide.

## 2019-01-16 NOTE — Progress Notes (Signed)
  Medical Nutrition Therapy:  Appt start time: 10:15 end time:  11:25.  Assessment:  Primary concerns today: Pt states he has been gaining weight since mid college years. Was able to lose weight when going to them gym.   States him and wife like to cook. States they know what to do but believes portion control is the problem. Pt states he feels its tedious to track everything. Reports he has been trying to reduce sugar and cream amount within coffee. Reports he no longer drinks sodas. Pt states he has stopped trying to buy juice but loves grapefruit juice. States he typically eats more than normal for dinner and usually has "unhealthy" snacks after dinner.  Pt states he has a bicycle, treadmill, and resistance bands at home. Reports he always reads that you should cut out carbohydrates and dairy but he loves pasta. States he feels bad about having fast food sometimes because he could have cooked something.   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 Lives with wife and 5 month old baby boy.   Pt expectations: a set of recipes of easy things with amounts along with schedule. Wants to know how many calories to eat at each meal and snack. Wants something that he can hold himself accountable to  Preferred Learning Style:   No preference indicated   Learning Readiness:   Ready  Change in progress   MEDICATIONS: See list   DIETARY INTAKE:  Usual eating pattern includes 2 meals and 2 snacks per day.  Everyday foods include greek yogurt, chips, salsa, smoothie, bagel, and pasta.   Avoided foods include frozen meals, fried foods, processed items, and fast food.    24-hr recall:  B (9 AM): typically skips; smoothie (greek yogurt + fruit), bagel + cream cheese + coffee (liquid creamer + sugar) Snk ( AM):   L (12 PM): chili  Snk ( PM): chips + homemade salsa D (6 PM): 5 Perogies + sauteed onions + greek yogurt +  Shredded cheese Snk ( PM): candy bar or ice cream Beverages: water,  large coffee (with little egg nog), alcoholic cider (0000000)  Usual physical activity: none reported  Estimated energy needs: 2000 calories 225 g carbohydrates 150 g protein 56 g fat  Progress Towards Goal(s):  In progress.   Nutritional Diagnosis:  NB-1.1 Food and nutrition-related knowledge deficit As related to prior exposure to incorrect information.  As evidenced by verbalizes inaccurate information.    Intervention:  Nutrition education and counseling. Pt was educated and counseled on diet culture, effects of diets, ways to have balanced meals/snacks, and the benefits of physical activity. Pt was in agreement with goals listed.  Goals: - Aim to increase physical activity to at least 30-45 min, 2 days/week.  - Check out [employer] app as option for at-home workouts.  - Aim to have breakfast daily containing at least carbohydrates + protein.  - Aim to have protein, carbohydrates, and non-starchy vegetables with lunch and dinner. Use meal ideas sheet as guide.   Teaching Method Utilized:  Visual Auditory Hands on  Handouts given during visit include:  Meal Ideas  Barriers to learning/adherence to lifestyle change: none identified  Demonstrated degree of understanding via:  Teach Back   Monitoring/Evaluation:  Dietary intake, exercise, and body weight prn.

## 2019-06-06 ENCOUNTER — Ambulatory Visit: Payer: 59 | Attending: Internal Medicine

## 2019-06-06 DIAGNOSIS — Z23 Encounter for immunization: Secondary | ICD-10-CM

## 2019-06-06 NOTE — Progress Notes (Signed)
   Covid-19 Vaccination Clinic  Name:  Gene Adams    MRN: [id number] DOB: Sep 12, 1990  06/06/2019  Mr. Holzworth was observed post Covid-19 immunization for 15 minutes without incident. He was provided with Vaccine Information Sheet and instruction to access the V-Safe system.   Mr. Petrich was instructed to call 911 with any severe reactions post vaccine: Marland Kitchen Difficulty breathing  . Swelling of face and throat  . A fast heartbeat  . A bad rash all over body  . Dizziness and weakness   Immunizations Administered    Name Date Dose VIS Date Route   Pfizer COVID-19 Vaccine 06/06/2019  9:52 AM 0.3 mL 02/14/2019 Intramuscular   Manufacturer: [place]   Lot: [id number]   [place]: [id number]

## 2019-06-30 ENCOUNTER — Ambulatory Visit: Payer: 59 | Attending: Internal Medicine

## 2019-06-30 DIAGNOSIS — Z23 Encounter for immunization: Secondary | ICD-10-CM

## 2019-06-30 NOTE — Progress Notes (Signed)
   Covid-19 Vaccination Clinic  Name:  Gene Adams    MRN: [id number] DOB: 1990-04-24  06/30/2019  [name] was observed post Covid-19 immunization for 15 minutes without incident. He was provided with Vaccine Information Sheet and instruction to access the V-Safe system.   [name] was instructed to call 911 with any severe reactions post vaccine: Marland Kitchen Difficulty breathing  . Swelling of face and throat  . A fast heartbeat  . A bad rash all over body  . Dizziness and weakness   Immunizations Administered    Name Date Dose VIS Date Route   Pfizer COVID-19 Vaccine 06/30/2019  2:24 PM 0.3 mL 04/30/2018 Intramuscular   Manufacturer: [place]   Lot: [id number]   [place]: [id number]

## 2020-03-11 ENCOUNTER — Other Ambulatory Visit: Payer: 59

## 2020-03-11 DIAGNOSIS — Z20822 Contact with and (suspected) exposure to covid-19: Secondary | ICD-10-CM

## 2020-03-13 LAB — SARS-COV-2, NAA 2 DAY TAT

## 2020-03-13 LAB — NOVEL CORONAVIRUS, NAA: SARS-CoV-2, NAA: NOT DETECTED
